# Patient Record
Sex: Male | Born: 1985 | Race: White | Hispanic: No | Marital: Married | Smoking: Current every day smoker
Health system: Southern US, Community
[De-identification: ages and names within clinical notes are randomized; demographics above are authoritative.]

---

## 2008-09-09 ENCOUNTER — Ambulatory Visit: Payer: Self-pay | Admitting: Radiology

## 2008-09-09 ENCOUNTER — Emergency Department (HOSPITAL_BASED_OUTPATIENT_CLINIC_OR_DEPARTMENT_OTHER): Admission: EM | Admit: 2008-09-09 | Discharge: 2008-09-09 | Payer: Self-pay | Admitting: Emergency Medicine

## 2008-09-11 ENCOUNTER — Ambulatory Visit: Payer: Self-pay | Admitting: Pulmonary Disease

## 2008-09-11 ENCOUNTER — Encounter: Payer: Self-pay | Admitting: Emergency Medicine

## 2008-09-11 ENCOUNTER — Ambulatory Visit: Payer: Self-pay | Admitting: Cardiothoracic Surgery

## 2008-09-11 ENCOUNTER — Inpatient Hospital Stay (HOSPITAL_COMMUNITY): Admission: EM | Admit: 2008-09-11 | Discharge: 2008-09-14 | Payer: Self-pay | Admitting: Emergency Medicine

## 2008-09-11 ENCOUNTER — Ambulatory Visit: Payer: Self-pay | Admitting: Diagnostic Radiology

## 2010-07-20 LAB — BASIC METABOLIC PANEL
BUN: 14 mg/dL (ref 6–23)
CO2: 30 mEq/L (ref 19–32)
Calcium: 9.5 mg/dL (ref 8.4–10.5)
Chloride: 105 mEq/L (ref 96–112)
Creatinine, Ser: 1 mg/dL (ref 0.4–1.5)
GFR calc Af Amer: >60 mL/min (ref >60)
GFR calc non Af Amer: >60 mL/min (ref >60)
Glucose, Bld: 94 mg/dL (ref 70–99)
Potassium: 4.1 mEq/L (ref 3.5–5.1)
Sodium: 142 mEq/L (ref 135–145)

## 2010-07-20 LAB — EXPECTORATED SPUTUM ASSESSMENT W GRAM STAIN, RFLX TO RESP C

## 2010-07-20 LAB — CBC
HCT: 43.3 % (ref 39.0–52.0)
HCT: 45.7 % (ref 39.0–52.0)
HCT: 45.9 % (ref 39.0–52.0)
Hemoglobin: 14.9 g/dL (ref 13.0–17.0)
Hemoglobin: 15.6 g/dL (ref 13.0–17.0)
Hemoglobin: 15.8 g/dL (ref 13.0–17.0)
MCHC: 34.2 g/dL (ref 30.0–36.0)
MCHC: 34.3 g/dL (ref 30.0–36.0)
MCHC: 34.5 g/dL (ref 30.0–36.0)
MCV: 90.2 fL (ref 78.0–100.0)
MCV: 91.4 fL (ref 78.0–100.0)
MCV: 91.9 fL (ref 78.0–100.0)
Platelets: 259 10*3/uL (ref 150–400)
Platelets: 262 10*3/uL (ref 150–400)
Platelets: 294 10*3/uL (ref 150–400)
RBC: 4.8 MIL/uL (ref 4.22–5.81)
RBC: 4.99 MIL/uL (ref 4.22–5.81)
RBC: 4.99 MIL/uL (ref 4.22–5.81)
RDW: 13.5 % (ref 11.5–15.5)
RDW: 13.5 % (ref 11.5–15.5)
RDW: 13.6 % (ref 11.5–15.5)
WBC: 10.7 10*3/uL — ABNORMAL HIGH (ref 4.0–10.5)
WBC: 10.9 10*3/uL — ABNORMAL HIGH (ref 4.0–10.5)
WBC: 11.9 10*3/uL — ABNORMAL HIGH (ref 4.0–10.5)

## 2010-07-20 LAB — DIFFERENTIAL
Basophils Absolute: 0 10*3/uL (ref 0.0–0.1)
Basophils Relative: 0 % (ref 0–1)
Eosinophils Absolute: 0.1 10*3/uL (ref 0.0–0.7)
Eosinophils Relative: 1 % (ref 0–5)
Lymphocytes Relative: 29 % (ref 12–46)
Lymphs Abs: 3.1 10*3/uL (ref 0.7–4.0)
Monocytes Absolute: 0.7 10*3/uL (ref 0.1–1.0)
Monocytes Relative: 7 % (ref 3–12)
Neutro Abs: 6.8 10*3/uL (ref 1.7–7.7)
Neutrophils Relative %: 64 % (ref 43–77)

## 2010-07-20 LAB — POCT I-STAT, CHEM 8
BUN: 20 mg/dL (ref 6–23)
Calcium, Ion: 1.18 mmol/L (ref 1.12–1.32)
Chloride: 103 meq/L (ref 96–112)
Creatinine, Ser: 1.2 mg/dL (ref 0.4–1.5)
Glucose, Bld: 90 mg/dL (ref 70–99)
HCT: 46 % (ref 39.0–52.0)
Hemoglobin: 15.6 g/dL (ref 13.0–17.0)
Potassium: 3.9 meq/L (ref 3.5–5.1)
Sodium: 138 meq/L (ref 135–145)
TCO2: 25 mmol/L (ref 0–100)

## 2010-08-25 NOTE — Consult Note (Signed)
NAMETELESFORO, BROSNAHAN NO.:  [id number]   MEDICAL RECORD NO.:  [id number]          PATIENT TYPE:  INP   LOCATION:  2005                         FACILITY:  [facility]   PHYSICIAN:  Felipa Evener, MD  DATE OF BIRTH:  [birth year]   DATE OF CONSULTATION:  DATE OF DISCHARGE:                                 CONSULTATION   ADDENDUM     The patient is a 25 year old male with a benign past medical history.  He has been smoking since age 22 who after a period of intense physical  activity started having some chest pains.  The chest x-ray __________  revealed pneumomediastinum.  The patient does have some wheezing on  examination; however, the wheezing was originating mostly from the upper  airway and resolves when the patient is pursed lip breathing consistent  with an upper airway type disorder.  Therefore, we would not start him  on any inhaler at this point.  We would discharge the patient with  regular followup.  I advised him strongly to quit smoking as  histiocytosis X syndrome could be a possibility for the  pneumomediastinum and that should decrease the amount of sputum and  therefore decrease the wheezing that is evident by the upper airway  sounds emanating __________ wheezing.  The patient to follow up with  Dr. Sherene Sires on September 26, 2008 at 2 p.m.      Felipa Evener, MD  Electronically Signed     WJY/MEDQ  D:  09/13/2008  T:  09/14/2008  Job:  [id number]
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
## 2010-08-25 NOTE — H&P (Signed)
Sergio Mckinney, Sergio Mckinney NO.:  [id number]   MEDICAL RECORD NO.:  [id number]          PATIENT TYPE:  INP   LOCATION:  2005                         FACILITY:  [facility]   PHYSICIAN:  Kerin Perna, M.D.  DATE OF BIRTH:  [birth year]   DATE OF ADMISSION:  09/11/2008  DATE OF DISCHARGE:                              HISTORY & PHYSICAL   ADMISSION DIAGNOSES:  1. Pneumomediastinum.  2. Asthmatic bronchitis.   CHIEF COMPLAINT:  Neck and upper chest discomfort.   PRESENT ILLNESS:  Sergio Mckinney is a 25 year old Caucasian male,  smoker, who presented to the emergency department in referral from the  [facility] with complaints of 8-10 hours of neck and  upper chest discomfort and mild difficulty swallowing.  He states he  developed the symptoms after eating a Whopper hamburger.  His chest x-  ray taken at the [facility]gent [facility] showed pneumomediastinum with some  subcutaneous air in the neck and upper mediastinum.  There is no  pneumothorax, no pleural effusion and the patient was nontoxic.   Over the previous weekend, the patient was diagnosed with asthmatic  bronchitis with bilateral wheezing.  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  He also smokes 2-1/2 packs of cigarettes daily.  He denies  any significant trauma.  He had been coughing and wheezing.  He  apparently received medical evaluation, was given a prescription for  bronchodilator inhaler, but the prescription has not been filled.  He  denies hemoptysis, fever, or syncope.  There is no prior history of  pneumothorax.  No history of significant prior chest trauma.   PAST MEDICAL HISTORY:  1. No known allergies.  2. No chronic medication.  3. No previous hospital admissions, no previous surgery.   SOCIAL HISTORY:  The patient is employed in a bakery.  He smokes  cigarettes 1-2 packs daily.  Mild alcohol intake.   PHYSICAL EXAMINATION:  VITAL SIGNS:  Temperature is 97.8, blood pressure  is 138/70, pulse is 88 sinus, and oxygen saturation on 2 liters is 98%.  HEENT:  Normocephalic.  Pupils equal.  NECK:  Without crepitus, but he is somewhat uncomfortable with palpation  of the neck.  There is no JVD.  LYMPHATICS:  No palpable adenopathy in neck or supraclavicular fossa.  LUNGS:  Breath sounds are with significant bilateral wheezes.  He has no  subcutaneous air in his chest.  There is no mediastinal crunch.  CARDIAC:  Rhythm is regular and there is no murmur or gallop.  ABDOMEN:  Soft and benign.  EXTREMITIES:  No clubbing, cyanosis, or edema.  Peripheral pulses are  intact.  NEUROLOGIC:  Nonfocal.   LABORATORY DATA:  Chest x-ray is reviewed.  This shows pneumomediastinum  with air tracking into the neck.  No pneumothorax.  Chest CT scan with  contrast shows pneumomediastinum with the subcutaneous air extending in  the neck.  There is no fluid collection or abscess in the mediastinum.  There is no evidence of extravasation of oral contrast into the neck  to  suggest esophageal leak or perforation.  There is no pneumothorax and no  blebs or bullous disease is identified in the lungs.   His hematocrit is 43, white count 10,000, and his platelet count is  250,000.   IMPRESSION AND PLAN:  The patient has pneumomediastinum probably related  to recent onset of asthmatic bronchitis possibly exacerbated by  barotrauma when he was swimming in a pool and doing some diving over the  weekend as well.  We will admit him for observation.  We will place him  on IV antibiotics and bronchodilators for treatment of his asthmatic  bronchitis.  He will be given pain medication and supplemental oxygen.  Plan was discussed with the patient and his family and they understand  and agree.      Kerin Perna, M.D.  Electronically Signed     PV/MEDQ  D:  09/12/2008  T:  09/12/2008  Job:  [id number]

## 2011-12-29 ENCOUNTER — Emergency Department (HOSPITAL_COMMUNITY): Payer: Self-pay

## 2011-12-29 ENCOUNTER — Encounter (HOSPITAL_COMMUNITY): Payer: Self-pay | Admitting: Emergency Medicine

## 2011-12-29 ENCOUNTER — Emergency Department (HOSPITAL_COMMUNITY)
Admission: EM | Admit: 2011-12-29 | Discharge: 2011-12-29 | Disposition: A | Discharge status: Home / Self Care | Payer: Self-pay | Attending: Emergency Medicine | Admitting: Emergency Medicine

## 2011-12-29 DIAGNOSIS — M65849 Other synovitis and tenosynovitis, unspecified hand: Secondary | ICD-10-CM | POA: Insufficient documentation

## 2011-12-29 DIAGNOSIS — M65839 Other synovitis and tenosynovitis, unspecified forearm: Secondary | ICD-10-CM | POA: Insufficient documentation

## 2011-12-29 DIAGNOSIS — M659 Synovitis and tenosynovitis, unspecified: Secondary | ICD-10-CM

## 2011-12-29 MED ORDER — HYDROCODONE-ACETAMINOPHEN 5-500 MG PO TABS: 1.0000 | ORAL_TABLET | Freq: Four times a day (QID) | ORAL | Status: AC | PRN

## 2011-12-29 MED ORDER — PREDNISONE 20 MG PO TABS: 60.0000 mg | ORAL_TABLET | Freq: Every day | ORAL | Status: AC

## 2011-12-29 MED ORDER — PREDNISONE 20 MG PO TABS
60.0000 mg | ORAL_TABLET | Freq: Once | ORAL | Status: AC
  Administered 2011-12-29: 60 mg via ORAL
  Filled 2011-12-29: qty 3

## 2011-12-29 MED ORDER — HYDROCODONE-ACETAMINOPHEN 5-325 MG PO TABS
1.0000 | ORAL_TABLET | Freq: Once | ORAL | Status: DC
  Filled 2011-12-29: qty 1

## 2011-12-29 NOTE — ED Notes (Signed)
Paged ortho 

## 2011-12-29 NOTE — Progress Notes (Signed)
Orthopedic Tech Progress Note Patient Details:  Sergio Mckinney 21-Oct-1985 [id number]  Ortho Devices Type of Ortho Device: Velcro wrist splint Ortho Device/Splint Location: left wrist splint Ortho Device/Splint Interventions: Application   Shawnie Pons 12/29/2011, 10:48 PM

## 2011-12-29 NOTE — ED Notes (Signed)
Pt c/o left wrist and hand pain. Pt reports he lifts things at work, doesn't remember a specific time that caused this. As the day went on his hand became weaker and starting experiencing pain in left hand with movement, then he noticed a grinding feeling to left wrist. Left lateral wrist is slightly swollen.

## 2011-12-29 NOTE — ED Notes (Signed)
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

## 2011-12-29 NOTE — ED Provider Notes (Signed)
History     CSN: [id number]  Arrival date & time 12/29/11  2047   First MD Initiated Contact with Patient 12/29/11 2227      Chief Complaint  Patient presents with  . Hand Problem    (Consider location/radiation/quality/duration/timing/severity/associated sxs/prior treatment) HPI  Patient presents to the ER with left wrist pain. 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 He endorses coming. Pt states that when he moves his fingers he feels a "grinding" sound. He is able to move the joints all around but with pain. No rash, redness, induration, acute injury, swelling. NAD/VSS     History reviewed. No pertinent past medical history.  History reviewed. No pertinent past surgical history.  History reviewed. No pertinent family history.  History  Substance Use Topics  . Smoking status: Current Every Day Smoker -- 0.5 packs/day    Types: Cigarettes  . Smokeless tobacco: Not on file  . Alcohol Use: No      Review of Systems   Review of Systems  Gen: no weight loss, fevers, chills, night sweats  Eyes: no discharge or drainage, no occular pain or visual changes  Nose: no epistaxis or rhinorrhea  Mouth: no dental pain, no sore throat  Neck: no neck pain  Lungs:No wheezing, coughing or hemoptysis CV: no chest pain, palpitations, dependent edema or orthopnea  Abd: no abdominal pain, nausea, vomiting  GU: no dysuria or gross hematuria  MSK:  + wrist pain L  Neuro: no headache, no focal neurologic deficits  Skin: no abnormalities Psyche: negative.    Allergies  Review of patient's allergies indicates no known allergies.  Home Medications   Current Outpatient Rx  Name Route Sig Dispense Refill  . HYDROCODONE-ACETAMINOPHEN 5-500 MG PO TABS Oral Take 1-2 tablets by mouth every 6 (six) hours as needed for pain. 15 tablet 0  . PREDNISONE 20 MG PO TABS Oral Take 3 tablets (60 mg total) by mouth daily. 15 tablet 0    BP 148/96  Pulse 82   Temp 98.2 F (36.8 C) (Oral)  Resp 16  SpO2 98%  Physical Exam  Nursing note and vitals reviewed. Constitutional: He appears well-developed and well-nourished. No distress.  HENT:  Head: Normocephalic and atraumatic.  Eyes: Pupils are equal, round, and reactive to light.  Neck: Normal range of motion. Neck supple.  Cardiovascular: Normal rate and regular rhythm.   Pulmonary/Chest: Effort normal.  Abdominal: Soft.  Musculoskeletal:       Left wrist: He exhibits tenderness, effusion and crepitus. He exhibits normal range of motion, no bony tenderness, no swelling and no laceration.  Neurological: He is alert.  Skin: Skin is warm and dry.    ED Course  Procedures (including critical care time)  Labs Reviewed - No data to display Dg Wrist Complete Left  12/29/2011  *RADIOLOGY REPORT*  Clinical Data: Pain and swelling.  LEFT WRIST - COMPLETE 3+ VIEW  Comparison: None.  Findings: Imaged bones, joints and soft tissues appear normal.  IMPRESSION: Normal study.   Original Report Authenticated By: Bernadene Bell. D'ALESSIO, M.D.      1. Tenosynovitis, wrist       MDM   Repetitive use has most likely caused patients symptoms of inflammation.  Pt given wrist splints and pain medication with a referral to hand.  Pt has been advised of the symptoms that warrant their return to the ED. Patient has voiced understanding and has agreed to follow-up with the PCP or specialist.  Dorthula Matas, PA 12/29/11 2256

## 2011-12-30 NOTE — ED Provider Notes (Signed)
Medical screening examination/treatment/procedure(s) were performed by non-physician practitioner and as supervising physician I was immediately available for consultation/collaboration.   Arlis Yale, MD 12/30/11 1618 

## 2019-06-23 ENCOUNTER — Encounter (HOSPITAL_COMMUNITY): Payer: Self-pay | Admitting: Emergency Medicine

## 2019-06-23 ENCOUNTER — Emergency Department (HOSPITAL_COMMUNITY): Payer: Medicaid Other

## 2019-06-23 ENCOUNTER — Other Ambulatory Visit: Payer: Self-pay

## 2019-06-23 ENCOUNTER — Emergency Department (HOSPITAL_COMMUNITY)
Admission: EM | Admit: 2019-06-23 | Discharge: 2019-06-23 | Disposition: A | Discharge status: Home / Self Care | Payer: Medicaid Other | Attending: Emergency Medicine | Admitting: Emergency Medicine

## 2019-06-23 DIAGNOSIS — W2209XA Striking against other stationary object, initial encounter: Secondary | ICD-10-CM | POA: Insufficient documentation

## 2019-06-23 DIAGNOSIS — Y999 Unspecified external cause status: Secondary | ICD-10-CM | POA: Insufficient documentation

## 2019-06-23 DIAGNOSIS — S62034A Nondisplaced fracture of proximal third of navicular [scaphoid] bone of right wrist, initial encounter for closed fracture: Secondary | ICD-10-CM | POA: Insufficient documentation

## 2019-06-23 DIAGNOSIS — F17210 Nicotine dependence, cigarettes, uncomplicated: Secondary | ICD-10-CM | POA: Diagnosis not present

## 2019-06-23 DIAGNOSIS — Y939 Activity, unspecified: Secondary | ICD-10-CM | POA: Diagnosis not present

## 2019-06-23 DIAGNOSIS — Y929 Unspecified place or not applicable: Secondary | ICD-10-CM | POA: Insufficient documentation

## 2019-06-23 DIAGNOSIS — S6991XA Unspecified injury of right wrist, hand and finger(s), initial encounter: Secondary | ICD-10-CM | POA: Diagnosis present

## 2019-06-23 NOTE — ED Triage Notes (Signed)
Pt states he fell last night.  C/o R wrist and hand pain and swelling.

## 2019-06-23 NOTE — ED Notes (Signed)
Patient given discharge instructions patient verbalizes understanding. 
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
## 2019-06-23 NOTE — Discharge Instructions (Signed)
Please keep your wrist in the thumb spica splint.  Please consider over-the-counter medications such as ibuprofen or Tylenol for symptom relief.  Please contact the office of Dr. Merlyn Lot for ongoing evaluation and management of your scaphoid fracture.  Return to the ED or seek immediate medical attention for any new or worsening symptoms.
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
